# Patient Record
Sex: Female | Born: 1970 | Race: Black or African American | Hispanic: No | Marital: Married | Smoking: Never smoker
Health system: Southern US, Community
[De-identification: ages and names within clinical notes are randomized; demographics above are authoritative.]

## PROBLEM LIST (undated history)

## (undated) DIAGNOSIS — J45909 Unspecified asthma, uncomplicated: Secondary | ICD-10-CM

## (undated) DIAGNOSIS — D649 Anemia, unspecified: Secondary | ICD-10-CM

## (undated) DIAGNOSIS — T7840XA Allergy, unspecified, initial encounter: Secondary | ICD-10-CM

## (undated) DIAGNOSIS — K219 Gastro-esophageal reflux disease without esophagitis: Secondary | ICD-10-CM

## (undated) DIAGNOSIS — R519 Headache, unspecified: Secondary | ICD-10-CM

## (undated) DIAGNOSIS — R51 Headache: Secondary | ICD-10-CM

## (undated) HISTORY — PX: ABDOMINAL HYSTERECTOMY: SHX81

## (undated) HISTORY — PX: AXILLARY LYMPH NODE BIOPSY: SHX5737

## (undated) HISTORY — PX: CARPAL TUNNEL RELEASE: SHX101

## (undated) HISTORY — DX: Allergy, unspecified, initial encounter: T78.40XA

## (undated) HISTORY — PX: APPENDECTOMY: SHX54

---

## 2007-01-07 ENCOUNTER — Ambulatory Visit: Payer: Self-pay | Admitting: Obstetrics and Gynecology

## 2007-01-11 ENCOUNTER — Inpatient Hospital Stay: Payer: Self-pay | Admitting: Obstetrics and Gynecology

## 2008-03-11 ENCOUNTER — Ambulatory Visit: Payer: Self-pay | Admitting: Orthopedic Surgery

## 2008-03-18 ENCOUNTER — Ambulatory Visit: Payer: Self-pay | Admitting: Orthopedic Surgery

## 2009-02-14 ENCOUNTER — Ambulatory Visit: Payer: Self-pay | Admitting: Internal Medicine

## 2009-07-16 ENCOUNTER — Ambulatory Visit: Payer: Self-pay | Admitting: Internal Medicine

## 2013-03-03 ENCOUNTER — Encounter: Payer: Self-pay | Admitting: Podiatry

## 2013-03-03 ENCOUNTER — Ambulatory Visit (INDEPENDENT_AMBULATORY_CARE_PROVIDER_SITE_OTHER): Payer: BC Managed Care – PPO | Admitting: Podiatry

## 2013-03-03 VITALS — BP 128/78 | HR 89 | Resp 16 | Ht 62.0 in | Wt 170.0 lb

## 2013-03-03 DIAGNOSIS — M778 Other enthesopathies, not elsewhere classified: Secondary | ICD-10-CM

## 2013-03-03 DIAGNOSIS — M779 Enthesopathy, unspecified: Secondary | ICD-10-CM

## 2013-03-03 DIAGNOSIS — G5791 Unspecified mononeuropathy of right lower limb: Secondary | ICD-10-CM

## 2013-03-03 DIAGNOSIS — G5790 Unspecified mononeuropathy of unspecified lower limb: Secondary | ICD-10-CM

## 2013-03-03 DIAGNOSIS — M7750 Other enthesopathy of unspecified foot: Secondary | ICD-10-CM

## 2013-03-03 NOTE — Progress Notes (Signed)
The right foot in this joint still hurting.  Objective: Vital signs are stable she is alert and oriented x3. She has pain on palpation and in range of motion of the first metatarsophalangeal joint of the right foot. She has pain on palpation of the medial dorsal cutaneous nerve as it courses over the first metatarsophalangeal joint right.  Assessment: Capsulitis the neuritis of the first metatarsophalangeal joint of the right foot.  Plan: Discussed appropriate shoe gear stretching exercises and ice therapy. Injected dexamethasone to the point of maximal tenderness today and I will followup with her as needed.

## 2013-11-03 ENCOUNTER — Ambulatory Visit: Payer: BC Managed Care – PPO | Admitting: Podiatry

## 2013-11-19 ENCOUNTER — Ambulatory Visit (INDEPENDENT_AMBULATORY_CARE_PROVIDER_SITE_OTHER): Payer: BC Managed Care – PPO | Admitting: Podiatry

## 2013-11-19 VITALS — BP 128/75 | HR 73 | Resp 16

## 2013-11-19 DIAGNOSIS — M778 Other enthesopathies, not elsewhere classified: Secondary | ICD-10-CM

## 2013-11-19 DIAGNOSIS — G5791 Unspecified mononeuropathy of right lower limb: Secondary | ICD-10-CM

## 2013-11-19 DIAGNOSIS — M779 Enthesopathy, unspecified: Secondary | ICD-10-CM

## 2013-11-19 DIAGNOSIS — M7751 Other enthesopathy of right foot: Secondary | ICD-10-CM

## 2013-11-19 MED ORDER — MELOXICAM 15 MG PO TABS: 15.0000 mg | ORAL_TABLET | Freq: Every day | ORAL | Status: DC

## 2013-11-19 MED ORDER — METHYLPREDNISOLONE (PAK) 4 MG PO TABS: ORAL_TABLET | ORAL | Status: DC

## 2013-11-19 NOTE — Progress Notes (Signed)
She presents today chief complaint of pain to the first metatarsophalangeal joint of the right foot. She states this has been going on for the past couple of months. The last time she was in the office was February 2015.  Objective: Vital signs are stable she is alert and oriented x3. She has pain on palpation and in range of motion of the first metatarsophalangeal joint of the right foot. There is no erythema edema cellulitis drainage or odor. Pulses are strongly palpable right foot.  Assessment: Capsulitis first metatarsophalangeal joint right foot. Paragraph plan: Discussed etiology pathology conservative versus surgical therapies..  Plan: Discussed etiology pathology conservative versus surgical therapies. At this point we injected 2 mg of dexamethasone into the joint itself after sterile Betadine skin prep I will followup with her in one month. We started her on Medrol Dosepak to be followed by meloxicam.

## 2014-04-24 ENCOUNTER — Ambulatory Visit: Payer: Self-pay | Admitting: Family Medicine

## 2014-07-15 ENCOUNTER — Other Ambulatory Visit: Payer: Self-pay | Admitting: Neurology

## 2014-07-15 DIAGNOSIS — G4452 New daily persistent headache (NDPH): Secondary | ICD-10-CM

## 2014-07-23 ENCOUNTER — Ambulatory Visit: Payer: Self-pay

## 2015-02-10 ENCOUNTER — Ambulatory Visit
Admission: EM | Admit: 2015-02-10 | Discharge: 2015-02-10 | Disposition: A | Discharge status: Home / Self Care | Payer: BLUE CROSS/BLUE SHIELD | Attending: Family Medicine | Admitting: Family Medicine

## 2015-02-10 ENCOUNTER — Encounter: Payer: Self-pay | Admitting: Emergency Medicine

## 2015-02-10 DIAGNOSIS — R112 Nausea with vomiting, unspecified: Secondary | ICD-10-CM | POA: Diagnosis not present

## 2015-02-10 DIAGNOSIS — N644 Mastodynia: Secondary | ICD-10-CM

## 2015-02-10 LAB — HCG, QUANTITATIVE, PREGNANCY: hCG, Beta Chain, Quant, S: <1 m[IU]/mL (ref <5)

## 2015-02-10 LAB — CBC WITH DIFFERENTIAL/PLATELET
Basophils Absolute: 0 10*3/uL (ref 0–0.1)
Basophils Relative: 0 %
EOS ABS: 0.2 10*3/uL (ref 0–0.7)
Eosinophils Relative: 2 %
HCT: 38.3 % (ref 35.0–47.0)
HEMOGLOBIN: 12.8 g/dL (ref 12.0–16.0)
LYMPHS ABS: 0.4 10*3/uL — AB (ref 1.0–3.6)
Lymphocytes Relative: 4 %
MCH: 28.9 pg (ref 26.0–34.0)
MCHC: 33.5 g/dL (ref 32.0–36.0)
MCV: 86.3 fL (ref 80.0–100.0)
MONOS PCT: 6 %
Monocytes Absolute: 0.6 10*3/uL (ref 0.2–0.9)
Neutro Abs: 8.8 10*3/uL — ABNORMAL HIGH (ref 1.4–6.5)
Neutrophils Relative %: 88 %
Platelets: 277 10*3/uL (ref 150–440)
RBC: 4.44 MIL/uL (ref 3.80–5.20)
RDW: 14.1 % (ref 11.5–14.5)
WBC: 10 10*3/uL (ref 3.6–11.0)

## 2015-02-10 LAB — COMPREHENSIVE METABOLIC PANEL
ALK PHOS: 45 U/L (ref 38–126)
ALT: 13 U/L — ABNORMAL LOW (ref 14–54)
AST: 20 U/L (ref 15–41)
Albumin: 4.2 g/dL (ref 3.5–5.0)
Anion gap: 9 (ref 5–15)
BILIRUBIN TOTAL: 0.8 mg/dL (ref 0.3–1.2)
BUN: 14 mg/dL (ref 6–20)
CALCIUM: 8.7 mg/dL — AB (ref 8.9–10.3)
CO2: 27 mmol/L (ref 22–32)
CREATININE: 0.56 mg/dL (ref 0.44–1.00)
Chloride: 102 mmol/L (ref 101–111)
GFR calc non Af Amer: >60 mL/min (ref >60)
Glucose, Bld: 137 mg/dL — ABNORMAL HIGH (ref 65–99)
Potassium: 4.2 mmol/L (ref 3.5–5.1)
Sodium: 138 mmol/L (ref 135–145)
TOTAL PROTEIN: 7.6 g/dL (ref 6.5–8.1)

## 2015-02-10 MED ORDER — ONDANSETRON 8 MG PO TBDP: 8.0000 mg | ORAL_TABLET | Freq: Two times a day (BID) | ORAL | Status: DC

## 2015-02-10 MED ORDER — ONDANSETRON 4 MG PO TBDP
4.0000 mg | ORAL_TABLET | Freq: Once | ORAL | Status: AC
  Administered 2015-02-10: 4 mg via ORAL

## 2015-02-10 NOTE — ED Notes (Signed)
Patient c/o breast enlargement and tenderness for 2 weeks.  Patient also reports that she started vomiting today.  Patient states that she has had a hysterectomy.  Patient denies fevers.

## 2015-02-10 NOTE — ED Provider Notes (Signed)
CSN: [id number]     Arrival date & time 02/10/15  1543 History   First MD Initiated Contact with Patient 02/10/15 1642     Chief Complaint  Patient presents with  . Emesis  . Breast Problem   (Consider location/radiation/quality/duration/timing/severity/associated sxs/prior Treatment) HPI Comments: 45 yo female with a 2 weeks h/o bilateral breast tenderness and swelling/enlargement. Denies any breast redness, injury, drainage, rash, nipple discharge, injuries. States both breasts feel the same, equally tender and enlarged. Also complains of bilateral lower pelvic pains, like premenstrual. Patient had a hysterectomy years ago, but states still has her ovaries. Today patient had sudden onset of nausea and vomiting. No diarrhea; states she's been constipated lately.   Patient is a 45 y.o. female presenting with vomiting.  Emesis   Past Medical History  Diagnosis Date  . Allergy    Past Surgical History  Procedure Laterality Date  . Abdominal hysterectomy    . Appendectomy    . Cesarean section     History reviewed. No pertinent family history. Social History  Substance Use Topics  . Smoking status: Never Smoker   . Smokeless tobacco: Never Used  . Alcohol Use: No   OB History    No data available     Review of Systems  Gastrointestinal: Positive for vomiting.    Allergies  Review of patient's allergies indicates no known allergies.  Home Medications   Prior to Admission medications   Medication Sig Start Date End Date Taking? Authorizing Provider  esomeprazole (NEXIUM) 20 MG capsule  09/15/13   Historical Provider, MD  fexofenadine-pseudoephedrine (ALLEGRA-D 24) 180-240 MG per 24 hr tablet Take 1 tablet by mouth daily.    Historical Provider, MD  fluticasone Aleda Grana(FLONASE) 50 MCG/ACT nasal spray  02/21/13   Historical Provider, MD  meloxicam (MOBIC) 15 MG tablet  10/22/13   Historical Provider, MD  meloxicam (MOBIC) 15 MG tablet Take 1 tablet (15 mg total) by mouth daily.  11/19/13   Max T Hyatt, DPM  methylPREDNIsolone (MEDROL DOSPACK) 4 MG tablet follow package directions 11/19/13   Max T Hyatt, DPM  metroNIDAZOLE (FLAGYL) 500 MG tablet  11/17/13   Historical Provider, MD  montelukast (SINGULAIR) 10 MG tablet  02/20/13   Historical Provider, MD  ondansetron (ZOFRAN ODT) 8 MG disintegrating tablet Take 1 tablet (8 mg total) by mouth 2 (two) times daily. 02/10/15   Payton Mccallumrlando Kahlyn Shippey, MD  PATADAY 0.2 % SOLN  02/21/13   Historical Provider, MD  PNEUMOVAX 23 25 MCG/0.5ML injection  09/15/13   Historical Provider, MD  traZODone (DESYREL) 50 MG tablet  10/12/13   Historical Provider, MD   Meds Ordered and Administered this Visit   Medications  ondansetron (ZOFRAN-ODT) disintegrating tablet 4 mg (4 mg Oral Given 02/10/15 1602)    BP 139/79 mmHg  Pulse 104  Temp(Src) 98.7 F (37.1 C) (Tympanic)  Resp 16  Ht 5\' 4"  (1.626 m)  Wt 170 lb (77.111 kg)  BMI 29.17 kg/m2  SpO2 100% No data found.   Physical Exam  Constitutional: She appears well-developed and well-nourished. No distress.  HENT:  Head: Normocephalic and atraumatic.  Right Ear: External ear normal.  Left Ear: External ear normal.  Nose: No nose lacerations, sinus tenderness, nasal deformity, septal deviation or nasal septal hematoma. No epistaxis.  No foreign bodies.  Mouth/Throat: Uvula is midline, oropharynx is clear and moist and mucous membranes are normal. No oropharyngeal exudate.  Eyes: Conjunctivae and EOM are normal. Pupils are equal, round, and reactive to light.  Right eye exhibits no discharge. Left eye exhibits no discharge. No scleral icterus.  Neck: Normal range of motion. Neck supple. Thyroid mass (approximately 1cm mildly tender soft tissue  subcutaneous mass  just left of midline on anterior neck area) present. No thyromegaly present.    Cardiovascular: Normal rate, regular rhythm and normal heart sounds.   Pulmonary/Chest: Effort normal and breath sounds normal. No respiratory distress.  She has no wheezes. She has no rales.  Abdominal: Soft. Bowel sounds are normal. She exhibits no distension and no mass. There is tenderness (mild epigastric ). There is no rebound and no guarding.  Genitourinary: There is breast swelling and tenderness. No breast discharge or bleeding.  Lymphadenopathy:    She has no cervical adenopathy.  Neurological: She is alert.  Skin: No rash noted. She is not diaphoretic.  Nursing note and vitals reviewed.   ED Course  Procedures (including critical care time)  Labs Review Labs Reviewed  CBC WITH DIFFERENTIAL/PLATELET - Abnormal; Notable for the following:    Neutro Abs 8.8 (*)    Lymphs Abs 0.4 (*)    All other components within normal limits  COMPREHENSIVE METABOLIC PANEL - Abnormal; Notable for the following:    Glucose, Bld 137 (*)    Calcium 8.7 (*)    ALT 13 (*)    All other components within normal limits  TSH  PROLACTIN  PARATHYROID HORMONE, INTACT (NO CA)  HCG, QUANTITATIVE, PREGNANCY    Imaging Review No results found.   Visual Acuity Review  Right Eye Distance:   Left Eye Distance:   Bilateral Distance:    Right Eye Near:   Left Eye Near:    Bilateral Near:         MDM   1. Breast tenderness   2. Nausea and vomiting, vomiting of unspecified type    Discharge Medication List as of 02/10/2015  6:13 PM    START taking these medications   Details  ondansetron (ZOFRAN ODT) 8 MG disintegrating tablet Take 1 tablet (8 mg total) by mouth 2 (two) times daily., Starting 02/10/2015, Until Discontinued, Normal       1. Lab results and possible etiologies reviewed with patient 2. rx as per orders above; reviewed possible side effects, interactions, risks and benefits  3. Recommend supportive treatment with clear liquids, then advance slowly as tolerated 4. Patient given zofran 8mg  ODT x 1 in clinic with improvement of symptoms and patient was able to tolerate po fluids 5. TSH, prolactin and PTH blood tests  pending 6. Follow-up tomorrow with PCP as scheduled; discussed with patient, needs follow up with PCP as she may need further testing (labs and imaging?)   Payton Mccallum, MD 02/10/15 1828

## 2015-02-11 LAB — PROLACTIN: Prolactin: 35.9 ng/mL — ABNORMAL HIGH (ref 4.8–23.3)

## 2015-02-12 LAB — PARATHYROID HORMONE, INTACT (NO CA): PTH: 72 pg/mL — ABNORMAL HIGH (ref 15–65)

## 2015-02-12 LAB — TSH: TSH: 0.586 u[IU]/mL (ref 0.350–4.500)

## 2015-02-13 ENCOUNTER — Telehealth: Payer: Self-pay

## 2015-02-13 NOTE — ED Notes (Signed)
Prolactin and Parathyroid elevated. Dr. Judd Gaudieronty informed. Patient contacted and stated has seen PCP and further tests and studies have [facility]

## 2015-02-13 NOTE — ED Notes (Signed)
Elevated Prolactin level and  Elevated Parathyroid level. Have not contacted patient.

## 2015-02-13 NOTE — ED Notes (Signed)
Elevated.

## 2015-04-14 ENCOUNTER — Ambulatory Visit (INDEPENDENT_AMBULATORY_CARE_PROVIDER_SITE_OTHER): Payer: BLUE CROSS/BLUE SHIELD | Admitting: Podiatry

## 2015-04-14 ENCOUNTER — Encounter: Payer: Self-pay | Admitting: Podiatry

## 2015-04-14 VITALS — BP 126/62 | HR 91 | Resp 16

## 2015-04-14 DIAGNOSIS — M7751 Other enthesopathy of right foot: Secondary | ICD-10-CM

## 2015-04-14 DIAGNOSIS — M779 Enthesopathy, unspecified: Principal | ICD-10-CM

## 2015-04-14 DIAGNOSIS — M778 Other enthesopathies, not elsewhere classified: Secondary | ICD-10-CM

## 2015-04-14 MED ORDER — MELOXICAM 15 MG PO TABS: 15.0000 mg | ORAL_TABLET | Freq: Every day | ORAL | Status: AC

## 2015-04-14 MED ORDER — METHYLPREDNISOLONE 4 MG PO TBPK: ORAL_TABLET | ORAL | Status: DC

## 2015-04-14 NOTE — Progress Notes (Signed)
She presents today for follow-up of her first metatarsophalangeal joint right foot. She states that after the last injection it quieted down quite nicely but it really never got better. She states that she would like to have another injection. We have not seen her since 2015. She denies any changes in her past medical history medications allergies surgeries or social history.  Objective: Vital signs are stable alert and oriented 3. Pulses are strongly palpable. Neurologic sensorium is intact per Semmes-Weinstein monofilament. No open lesions or wounds. Mild erythema and edema surrounding the first metatarsophalangeal joint of the right foot. It is tender on range of motion.  Assessment: Capsulitis first metatarsophalangeal joint right foot.  Plan: I injected today with dexamethasone and local anesthetic after sterile Betadine skin prep. I will follow-up with her on an as-needed basis.

## 2015-05-11 ENCOUNTER — Telehealth: Payer: Self-pay | Admitting: *Deleted

## 2015-05-11 NOTE — Telephone Encounter (Signed)
Diclofenac 75 mg bid

## 2015-05-11 NOTE — Telephone Encounter (Signed)
Patient requesting a different medication. States that the meloxicam is not helping. Please advise.

## 2015-05-11 NOTE — Telephone Encounter (Signed)
-----   Message from Lanney GinsLisa R Cox, [facility] sent at 05/11/2015  2:03 PM EDT ----- Elvina SidleHey, patient called and stated she was in the office about two weeks ago and got a shot and the medicine that Dr Al CorpusHyatt prescribed is not helping and would like something else and her foot is killing her and phone number is [phone]. Thanks Misty StanleyLisa

## 2015-05-12 MED ORDER — DICLOFENAC SODIUM 75 MG PO TBEC: 75.0000 mg | DELAYED_RELEASE_TABLET | Freq: Two times a day (BID) | ORAL | Status: DC

## 2015-05-12 NOTE — Telephone Encounter (Signed)
Left message informing pt the new rx had [facility] [place] [address]

## 2015-05-12 NOTE — Addendum Note (Signed)
Addended by: Alphia Kava'CONNELL, VALERY D on: 05/12/2015 03:11 PM   Modules accepted: Orders

## 2016-05-25 ENCOUNTER — Ambulatory Visit
Admission: RE | Admit: 2016-05-25 | Discharge: 2016-05-25 | Disposition: A | Discharge status: Home / Self Care | Payer: Worker's Compensation | Source: Ambulatory Visit | Attending: Family | Admitting: Family

## 2016-05-25 ENCOUNTER — Other Ambulatory Visit: Payer: Self-pay | Admitting: Family

## 2016-05-25 DIAGNOSIS — M25562 Pain in left knee: Secondary | ICD-10-CM | POA: Diagnosis not present

## 2016-05-25 DIAGNOSIS — M25662 Stiffness of left knee, not elsewhere classified: Secondary | ICD-10-CM | POA: Diagnosis not present

## 2016-05-25 DIAGNOSIS — R52 Pain, unspecified: Secondary | ICD-10-CM

## 2016-05-25 DIAGNOSIS — W19XXXA Unspecified fall, initial encounter: Secondary | ICD-10-CM | POA: Insufficient documentation

## 2016-08-07 ENCOUNTER — Ambulatory Visit
Admission: EM | Admit: 2016-08-07 | Discharge: 2016-08-07 | Discharge status: Absent without leave | Payer: BLUE CROSS/BLUE SHIELD

## 2016-09-17 ENCOUNTER — Other Ambulatory Visit: Payer: Self-pay | Admitting: Emergency Medicine

## 2016-11-03 ENCOUNTER — Other Ambulatory Visit: Payer: Self-pay | Admitting: Orthopedic Surgery

## 2016-11-16 ENCOUNTER — Inpatient Hospital Stay: Admission: RE | Admit: 2016-11-16 | Payer: Self-pay | Source: Ambulatory Visit

## 2016-11-17 ENCOUNTER — Encounter: Admission: RE | Admit: 2016-11-17 | Payer: BLUE CROSS/BLUE SHIELD | Source: Ambulatory Visit

## 2016-11-20 ENCOUNTER — Inpatient Hospital Stay: Admission: RE | Admit: 2016-11-20 | Payer: BLUE CROSS/BLUE SHIELD | Source: Ambulatory Visit

## 2016-11-21 ENCOUNTER — Encounter
Admission: RE | Admit: 2016-11-21 | Discharge: 2016-11-21 | Disposition: A | Discharge status: Home / Self Care | Payer: BLUE CROSS/BLUE SHIELD | Source: Ambulatory Visit | Attending: Orthopedic Surgery | Admitting: Orthopedic Surgery

## 2016-11-21 HISTORY — DX: Anemia, unspecified: D64.9

## 2016-11-21 HISTORY — DX: Unspecified asthma, uncomplicated: J45.909

## 2016-11-21 HISTORY — DX: Headache, unspecified: R51.9

## 2016-11-21 HISTORY — DX: Gastro-esophageal reflux disease without esophagitis: K21.9

## 2016-11-21 HISTORY — DX: Headache: R51

## 2016-11-21 NOTE — Patient Instructions (Signed)
Your procedure is scheduled on: 11-23-16 THURSDAY Report to Same Day Surgery 2nd floor medical mall [facility](Medical Mall Entrance-take elevator on left to 2nd floor.  Check in with surgery information desk.) To find out your arrival time please call [phone] between 1PM - 3PM [facility]  Remember: Instructions that are not followed completely may result in serious medical risk, up to and including death, or upon the discretion of your surgeon and anesthesiologist your surgery may need to be rescheduled.    _x___ 1. Do not eat food after midnight the night before your procedure. NO GUM CHEWING OR HARD CANDIES.  You may drink clear liquids up to 2 hours before you are scheduled to arrive at the hospital for your procedure.  Do not drink clear liquids within 2 hours of your scheduled arrival to the hospital.  Clear liquids include  --Water or Apple juice without pulp  --Clear carbohydrate beverage such as ClearFast or Gatorade  --Black Coffee or Clear Tea (No milk, no creamers, do not add anything to the coffee or Tea)  Type 1 and type 2 diabetics should only drink water.     __x__ 2. No Alcohol for 24 hours before or after surgery.   __x__3. No Smoking for 24 prior to surgery.   ____  4. Bring all medications with you on the day of surgery if instructed.    __x__ 5. Notify your doctor if there is any change in your medical condition     (cold, fever, infections).     Do not wear jewelry, make-up, hairpins, clips or nail polish.  Do not wear lotions, powders, or perfumes. You may wear deodorant.  Do not shave 48 hours prior to surgery. Men may shave face and neck.  Do not bring valuables to the hospital.    [facility] is not responsible for any belongings or valuables.               Contacts, dentures or bridgework may not be worn into surgery.  Leave your suitcase in the car. After surgery it may be brought to your room.  For patients admitted to the hospital, discharge  time is determined by your treatment team.   Patients discharged the day of surgery will not be allowed to drive home.  You will need someone to drive you home and stay with you the night of your procedure.    Please read over the following fact sheets that you were given:   [facility] Preparing for Surgery and or MRSA Information   _x___ TAKE THE FOLLOWING MEDICATIONS THE MORNING OF SURGERY WITH A SMALL SIP OF WATER. These include:  1. NEXIUM  2. TAKE A NEXIUM ON Wednesday NIGHT BEFORE BED  3.  4.  5.  6.  ____Fleets enema or Magnesium Citrate as directed.   _x___ Use CHG Soap or sage wipes as directed on instruction sheet   _X___ Use inhalers on the day of surgery and bring to hospital day of surgery-USE ALBUTEROL INHALER AT HOME AND BRING INHALER TO HOSPITAL  ____ Stop Metformin and Janumet 2 days prior to surgery.    ____ Take 1/2 of usual insulin dose the night before surgery and none on the morning surgery.   ____ Follow recommendations from Cardiologist, Pulmonologist or PCP regarding stopping Aspirin, Coumadin, Plavix ,Eliquis, Effient, or Pradaxa, and Pletal.  X____Stop Anti-inflammatories such as Advil, Aleve, Ibuprofen, Motrin, Naproxen,MELOXICAM, Naprosyn, BC POWDERS or aspirin products NOW-OK to take Tylenol    ____  Stop supplements until after surgery.     ____ Bring C-Pap to the hospital.

## 2016-11-22 ENCOUNTER — Ambulatory Visit
Admission: RE | Admit: 2016-11-22 | Discharge: 2016-11-22 | Disposition: A | Discharge status: Home / Self Care | Payer: Worker's Compensation | Source: Ambulatory Visit | Attending: Orthopedic Surgery | Admitting: Orthopedic Surgery

## 2016-11-22 DIAGNOSIS — Z01818 Encounter for other preprocedural examination: Secondary | ICD-10-CM | POA: Insufficient documentation

## 2016-11-22 LAB — APTT: APTT: 28 s (ref 24–36)

## 2016-11-22 LAB — CBC WITH DIFFERENTIAL/PLATELET
Basophils Absolute: 0 10*3/uL (ref 0–0.1)
Basophils Relative: 1 %
EOS ABS: 0.4 10*3/uL (ref 0–0.7)
EOS PCT: 8 %
HCT: 37.8 % (ref 35.0–47.0)
HEMOGLOBIN: 12.7 g/dL (ref 12.0–16.0)
LYMPHS ABS: 2 10*3/uL (ref 1.0–3.6)
LYMPHS PCT: 34 %
MCH: 28.7 pg (ref 26.0–34.0)
MCHC: 33.6 g/dL (ref 32.0–36.0)
MCV: 85.6 fL (ref 80.0–100.0)
Monocytes Absolute: 0.5 10*3/uL (ref 0.2–0.9)
Monocytes Relative: 9 %
Neutro Abs: 2.8 10*3/uL (ref 1.4–6.5)
Neutrophils Relative %: 48 %
PLATELETS: 287 10*3/uL (ref 150–440)
RBC: 4.42 MIL/uL (ref 3.80–5.20)
RDW: 14 % (ref 11.5–14.5)
WBC: 5.7 10*3/uL (ref 3.6–11.0)

## 2016-11-22 LAB — BASIC METABOLIC PANEL
Anion gap: 6 (ref 5–15)
BUN: 11 mg/dL (ref 6–20)
CO2: 25 mmol/L (ref 22–32)
Calcium: 8.8 mg/dL — ABNORMAL LOW (ref 8.9–10.3)
Chloride: 106 mmol/L (ref 101–111)
Creatinine, Ser: 0.56 mg/dL (ref 0.44–1.00)
GFR calc Af Amer: >60 mL/min (ref >60)
GLUCOSE: 100 mg/dL — AB (ref 65–99)
POTASSIUM: 3.9 mmol/L (ref 3.5–5.1)
Sodium: 137 mmol/L (ref 135–145)

## 2016-11-22 LAB — PROTIME-INR
INR: 1.03
Prothrombin Time: 13.4 seconds (ref 11.4–15.2)

## 2016-11-23 ENCOUNTER — Ambulatory Visit: Payer: Worker's Compensation | Admitting: Certified Registered Nurse Anesthetist

## 2016-11-23 ENCOUNTER — Encounter: Payer: Self-pay | Admitting: *Deleted

## 2016-11-23 ENCOUNTER — Encounter
Admission: RE | Disposition: A | Discharge status: Home / Self Care | Payer: Self-pay | Source: Ambulatory Visit | Attending: Orthopedic Surgery

## 2016-11-23 ENCOUNTER — Ambulatory Visit
Admission: RE | Admit: 2016-11-23 | Discharge: 2016-11-23 | Disposition: A | Discharge status: Home / Self Care | Payer: Worker's Compensation | Source: Ambulatory Visit | Attending: Orthopedic Surgery | Admitting: Orthopedic Surgery

## 2016-11-23 DIAGNOSIS — M65862 Other synovitis and tenosynovitis, left lower leg: Secondary | ICD-10-CM | POA: Diagnosis not present

## 2016-11-23 DIAGNOSIS — X58XXXA Exposure to other specified factors, initial encounter: Secondary | ICD-10-CM | POA: Diagnosis not present

## 2016-11-23 DIAGNOSIS — K219 Gastro-esophageal reflux disease without esophagitis: Secondary | ICD-10-CM | POA: Insufficient documentation

## 2016-11-23 DIAGNOSIS — Z791 Long term (current) use of non-steroidal anti-inflammatories (NSAID): Secondary | ICD-10-CM | POA: Diagnosis not present

## 2016-11-23 DIAGNOSIS — Z7982 Long term (current) use of aspirin: Secondary | ICD-10-CM | POA: Insufficient documentation

## 2016-11-23 DIAGNOSIS — D649 Anemia, unspecified: Secondary | ICD-10-CM | POA: Insufficient documentation

## 2016-11-23 DIAGNOSIS — Z79899 Other long term (current) drug therapy: Secondary | ICD-10-CM | POA: Diagnosis not present

## 2016-11-23 DIAGNOSIS — S83232A Complex tear of medial meniscus, current injury, left knee, initial encounter: Secondary | ICD-10-CM | POA: Diagnosis not present

## 2016-11-23 DIAGNOSIS — J45909 Unspecified asthma, uncomplicated: Secondary | ICD-10-CM | POA: Insufficient documentation

## 2016-11-23 HISTORY — PX: KNEE ARTHROSCOPY WITH MEDIAL MENISECTOMY: SHX5651

## 2016-11-23 SURGERY — ARTHROSCOPY, KNEE, WITH MEDIAL MENISCECTOMY
Anesthesia: General | Laterality: Left

## 2016-11-23 MED ORDER — PHENYLEPHRINE HCL 10 MG/ML IJ SOLN
INTRAMUSCULAR | Status: DC | PRN
  Administered 2016-11-23 (×4): 100 ug via INTRAVENOUS

## 2016-11-23 MED ORDER — ASPIRIN EC 325 MG PO TBEC: 325.0000 mg | DELAYED_RELEASE_TABLET | Freq: Every day | ORAL | 0 refills | Status: AC

## 2016-11-23 MED ORDER — MIDAZOLAM HCL 2 MG/2ML IJ SOLN
INTRAMUSCULAR | Status: AC
  Filled 2016-11-23: qty 2

## 2016-11-23 MED ORDER — LACTATED RINGERS IV SOLN
INTRAVENOUS | Status: DC
  Administered 2016-11-23: 09:00:00 via INTRAVENOUS

## 2016-11-23 MED ORDER — GLYCOPYRROLATE 0.2 MG/ML IJ SOLN
INTRAMUSCULAR | Status: AC
  Filled 2016-11-23: qty 1

## 2016-11-23 MED ORDER — DEXAMETHASONE SODIUM PHOSPHATE 10 MG/ML IJ SOLN
INTRAMUSCULAR | Status: AC
  Filled 2016-11-23: qty 1

## 2016-11-23 MED ORDER — LIDOCAINE HCL (PF) 1 % IJ SOLN
INTRAMUSCULAR | Status: DC | PRN
  Administered 2016-11-23: 9 mL

## 2016-11-23 MED ORDER — FENTANYL CITRATE (PF) 100 MCG/2ML IJ SOLN
25.0000 ug | INTRAMUSCULAR | Status: DC | PRN
  Administered 2016-11-23 (×3): 25 ug via INTRAVENOUS

## 2016-11-23 MED ORDER — ONDANSETRON HCL 4 MG/2ML IJ SOLN
INTRAMUSCULAR | Status: AC
  Filled 2016-11-23: qty 2

## 2016-11-23 MED ORDER — LIDOCAINE HCL (PF) 1 % IJ SOLN
INTRAMUSCULAR | Status: AC
  Filled 2016-11-23: qty 30

## 2016-11-23 MED ORDER — ALBUTEROL SULFATE HFA 108 (90 BASE) MCG/ACT IN AERS
INHALATION_SPRAY | RESPIRATORY_TRACT | Status: AC
  Filled 2016-11-23: qty 6.7

## 2016-11-23 MED ORDER — FENTANYL CITRATE (PF) 100 MCG/2ML IJ SOLN
INTRAMUSCULAR | Status: AC
  Filled 2016-11-23: qty 2

## 2016-11-23 MED ORDER — ACETAMINOPHEN 10 MG/ML IV SOLN
INTRAVENOUS | Status: DC | PRN
  Administered 2016-11-23: 1000 mg via INTRAVENOUS

## 2016-11-23 MED ORDER — GLYCOPYRROLATE 0.2 MG/ML IJ SOLN
INTRAMUSCULAR | Status: DC | PRN
  Administered 2016-11-23: 0.2 mg via INTRAVENOUS

## 2016-11-23 MED ORDER — PROPOFOL 10 MG/ML IV BOLUS
INTRAVENOUS | Status: DC | PRN
  Administered 2016-11-23: 200 mg via INTRAVENOUS

## 2016-11-23 MED ORDER — CEFAZOLIN SODIUM-DEXTROSE 2-4 GM/100ML-% IV SOLN
2.0000 g | INTRAVENOUS | Status: AC
  Administered 2016-11-23: 2 g via INTRAVENOUS

## 2016-11-23 MED ORDER — BUPIVACAINE-EPINEPHRINE (PF) 0.25% -1:200000 IJ SOLN
INTRAMUSCULAR | Status: DC | PRN
  Administered 2016-11-23: 20 mL

## 2016-11-23 MED ORDER — LIDOCAINE HCL (CARDIAC) 20 MG/ML IV SOLN
INTRAVENOUS | Status: DC | PRN
  Administered 2016-11-23: 100 mg via INTRAVENOUS

## 2016-11-23 MED ORDER — ONDANSETRON HCL 4 MG/2ML IJ SOLN
4.0000 mg | Freq: Once | INTRAMUSCULAR | Status: DC | PRN
Start: 2016-11-23 — End: 2016-11-23

## 2016-11-23 MED ORDER — CHLORHEXIDINE GLUCONATE CLOTH 2 % EX PADS
6.0000 | MEDICATED_PAD | Freq: Once | CUTANEOUS | Status: AC
  Administered 2016-11-23: 6 via TOPICAL

## 2016-11-23 MED ORDER — PROPOFOL 10 MG/ML IV BOLUS
INTRAVENOUS | Status: AC
  Filled 2016-11-23: qty 20

## 2016-11-23 MED ORDER — CEFAZOLIN SODIUM-DEXTROSE 2-4 GM/100ML-% IV SOLN
INTRAVENOUS | Status: AC
  Filled 2016-11-23: qty 100

## 2016-11-23 MED ORDER — HYDROCODONE-ACETAMINOPHEN 5-325 MG PO TABS: 1.0000 | ORAL_TABLET | Freq: Four times a day (QID) | ORAL | 0 refills | Status: DC | PRN

## 2016-11-23 MED ORDER — FENTANYL CITRATE (PF) 100 MCG/2ML IJ SOLN
INTRAMUSCULAR | Status: AC
  Administered 2016-11-23: 25 ug via INTRAVENOUS
  Filled 2016-11-23: qty 2

## 2016-11-23 MED ORDER — ONDANSETRON HCL 4 MG PO TABS: 4.0000 mg | ORAL_TABLET | Freq: Three times a day (TID) | ORAL | 0 refills | Status: AC | PRN

## 2016-11-23 MED ORDER — HYDROCODONE-ACETAMINOPHEN 5-325 MG PO TABS
ORAL_TABLET | ORAL | Status: AC
  Filled 2016-11-23: qty 1

## 2016-11-23 MED ORDER — HYDROCODONE-ACETAMINOPHEN 5-325 MG PO TABS
1.0000 | ORAL_TABLET | Freq: Four times a day (QID) | ORAL | Status: DC | PRN
  Administered 2016-11-23: 1 via ORAL

## 2016-11-23 MED ORDER — FENTANYL CITRATE (PF) 100 MCG/2ML IJ SOLN
INTRAMUSCULAR | Status: DC | PRN
  Administered 2016-11-23 (×4): 50 ug via INTRAVENOUS

## 2016-11-23 MED ORDER — DEXAMETHASONE SODIUM PHOSPHATE 10 MG/ML IJ SOLN
INTRAMUSCULAR | Status: DC | PRN
  Administered 2016-11-23: 10 mg via INTRAVENOUS

## 2016-11-23 MED ORDER — BUPIVACAINE-EPINEPHRINE (PF) 0.25% -1:200000 IJ SOLN
INTRAMUSCULAR | Status: AC
  Filled 2016-11-23: qty 30

## 2016-11-23 MED ORDER — ACETAMINOPHEN 10 MG/ML IV SOLN
INTRAVENOUS | Status: AC
  Filled 2016-11-23: qty 100

## 2016-11-23 MED ORDER — MIDAZOLAM HCL 2 MG/2ML IJ SOLN
INTRAMUSCULAR | Status: DC | PRN
  Administered 2016-11-23: 2 mg via INTRAVENOUS

## 2016-11-23 MED ORDER — LIDOCAINE HCL (PF) 2 % IJ SOLN
INTRAMUSCULAR | Status: AC
  Filled 2016-11-23: qty 10

## 2016-11-23 MED ORDER — ONDANSETRON HCL 4 MG/2ML IJ SOLN
INTRAMUSCULAR | Status: DC | PRN
  Administered 2016-11-23 (×2): 4 mg via INTRAVENOUS

## 2016-11-23 MED ORDER — ALBUTEROL SULFATE HFA 108 (90 BASE) MCG/ACT IN AERS
INHALATION_SPRAY | RESPIRATORY_TRACT | Status: DC | PRN
  Administered 2016-11-23: 2 via RESPIRATORY_TRACT

## 2016-11-23 MED ORDER — CHLORHEXIDINE GLUCONATE CLOTH 2 % EX PADS: 6.0000 | MEDICATED_PAD | Freq: Once | CUTANEOUS | Status: DC

## 2016-11-23 SURGICAL SUPPLY — 44 items
ADAPTER IRRIG TUBE 2 SPIKE SOL (ADAPTER) ×4 IMPLANT
BUR RADIUS 3.5 (BURR) ×2 IMPLANT
BUR RADIUS 4.0X18.5 (BURR) ×2 IMPLANT
CANISTER SUCT LVC 12 LTR MEDI- (MISCELLANEOUS) IMPLANT
COOLER POLAR GLACIER W/PUMP (MISCELLANEOUS) IMPLANT
CUFF TOURN 24 STER (MISCELLANEOUS) ×2 IMPLANT
CUFF TOURN 30 STER DUAL PORT (MISCELLANEOUS) IMPLANT
DEVICE SUCT BLK HOLE OR FLOOR (MISCELLANEOUS) ×2 IMPLANT
DRAPE IMP U-DRAPE 54X76 (DRAPES) ×2 IMPLANT
DRAPE SHEET LG 3/4 BI-LAMINATE (DRAPES) ×2 IMPLANT
DURAPREP 26ML APPLICATOR (WOUND CARE) ×4 IMPLANT
GAUZE PETRO XEROFOAM 1X8 (MISCELLANEOUS) IMPLANT
GAUZE SPONGE 4X4 12PLY STRL (GAUZE/BANDAGES/DRESSINGS) ×2 IMPLANT
GAUZE XEROFORM 4X4 STRL (GAUZE/BANDAGES/DRESSINGS) ×2 IMPLANT
GLOVE BIOGEL PI IND STRL 6.5 (GLOVE) ×6 IMPLANT
GLOVE BIOGEL PI IND STRL 9 (GLOVE) ×1 IMPLANT
GLOVE BIOGEL PI INDICATOR 6.5 (GLOVE) ×6
GLOVE BIOGEL PI INDICATOR 9 (GLOVE) ×1
GLOVE SURG 9.0 ORTHO LTXF (GLOVE) ×4 IMPLANT
GOWN STRL REUS TWL 2XL XL LVL4 (GOWN DISPOSABLE) ×2 IMPLANT
GOWN STRL REUS W/ TWL LRG LVL3 (GOWN DISPOSABLE) ×1 IMPLANT
GOWN STRL REUS W/TWL LRG LVL3 (GOWN DISPOSABLE) ×1
IV LACTATED RINGER IRRG 3000ML (IV SOLUTION) ×6
IV LR IRRIG 3000ML ARTHROMATIC (IV SOLUTION) ×6 IMPLANT
KIT RM TURNOVER STRD PROC AR (KITS) ×2 IMPLANT
MANIFOLD NEPTUNE II (INSTRUMENTS) ×2 IMPLANT
MAT BLUE FLOOR 46X72 FLO (MISCELLANEOUS) ×2 IMPLANT
NEEDLE HYPO 22GX1.5 SAFETY (NEEDLE) ×2 IMPLANT
PACK ARTHROSCOPY KNEE (MISCELLANEOUS) ×2 IMPLANT
PAD ABD DERMACEA PRESS 5X9 (GAUZE/BANDAGES/DRESSINGS) ×4 IMPLANT
PAD WRAPON POLAR KNEE (MISCELLANEOUS) ×1 IMPLANT
SET TUBE SUCT SHAVER OUTFL 24K (TUBING) ×2 IMPLANT
SET TUBE TIP INTRA-ARTICULAR (MISCELLANEOUS) ×2 IMPLANT
SOL PREP PVP 2OZ (MISCELLANEOUS)
SOLUTION PREP PVP 2OZ (MISCELLANEOUS) IMPLANT
STRIP CLOSURE SKIN 1/2X4 (GAUZE/BANDAGES/DRESSINGS) ×2 IMPLANT
SUT ETHILON 4-0 (SUTURE) ×1
SUT ETHILON 4-0 FS2 18XMFL BLK (SUTURE) ×1
SUTURE ETHLN 4-0 FS2 18XMF BLK (SUTURE) ×1 IMPLANT
SYR 20CC LL (SYRINGE) ×2 IMPLANT
TUBING ARTHRO INFLOW-ONLY STRL (TUBING) ×2 IMPLANT
WAND HAND CNTRL MULTIVAC 50 (MISCELLANEOUS) IMPLANT
WAND HAND CNTRL MULTIVAC 90 (MISCELLANEOUS) ×2 IMPLANT
WRAPON POLAR PAD KNEE (MISCELLANEOUS) ×2

## 2016-11-23 NOTE — Discharge Instructions (Signed)

## 2016-11-23 NOTE — Anesthesia Post-op Follow-up Note (Signed)
Anesthesia QCDR form completed.        

## 2016-11-23 NOTE — Op Note (Signed)
  PATIENT:  Jessica Hicks  PRE-OPERATIVE DIAGNOSIS:  TEAR OF MEDIAL MENISCUS, LEFT KNEE  POST-OPERATIVE DIAGNOSIS:  TEAR OF MEDIAL MENISCUS, LEFT KNEE with synovitis, chondrosis of medial femoral condyle.  PROCEDURE:  KNEE ARTHROSCOPY WITH  Partial MEDIAL MENISECTOMY, synovectomy, chondroplasty of the medial femoral condyle  SURGEON:  Thornton Park, MD  ANESTHESIA:   General  PREOPERATIVE INDICATIONS:  Jessica Hicks  46 y.o. female with a diagnosis of TEAR OF MEDIAL MENISCUS, LEFT KNEE who failed conservative management and elected for surgical management.    The risks benefits and alternatives were discussed with the patient preoperatively including the risks of infection, bleeding, nerve injury, knee stiffness, persistent pain, osteoarthritis and the need for further surgery. Medical  risks include DVT and pulmonary embolism, myocardial infarction, stroke, pneumonia, respiratory failure and death. The patient understood these risks and wished to proceed.  OPERATIVE PROCEDURE: Patient was met in the preoperative area. The left operative lower extremity was signed with the word yes and my initials according the hospital's correct site of surgery protocol.  The patient was brought to the operating room where they was placed supine on the operative table. General anesthesia was administered. The patient was prepped and draped in a sterile fashion.  A timeout was performed to verify the patient's name, date of birth, medical record number, correct site of surgery correct procedure to be performed. It was also used to verify the patient received antibiotics that all appropriate instruments, and radiographic studies were available in the room. Once all in attendance were in agreement, the case began.  Proposed arthroscopy incisions were drawn out with a surgical marker. These were pre-injected with 1% lidocaine plain. An 11 blade was used to establish an inferior lateral and  inferomedial portals. The inferomedial portal was created using a 18-gauge spinal needle under direct visualization.  A full diagnostic examination of the knee was performed including the suprapatellar pouch, patellofemoral joint, medial lateral compartments as well as the medial lateral gutters, the intercondylar notch in the posterior knee.  Findings on arthroscopy included diffuse synovitis, chondrosis of the medial femoral condyle, tear involving the posterior horn of the medial meniscus with a radial component extending into the meniscal root. .  Patient had the unstable medial meniscal tear treated with a 4-0 resector shaver blade and straight duckbill basket. The meniscus was debrided until a stable rim was achieved. Probe was used to ensure a stable meniscal rim after meniscectomy was completed.  A chondroplasty of the medial femoral condyle was also performed using a 4-0 resector shaver blade. A partial synovectomy was also performed using a 4-0 resector shaver blade and 90 ArthroCare wand.  The knee was then copiously lavaged. All arthroscopic instruments were removed. The 2 arthroscopy portals were closed with 4-0 nylon. Steri-Strips were applied along with a dry sterile and compressive dressing. The patient was brought to the PACU in stable condition.   I was scrubbed and present for the entire case and all sharp and instrument counts were correct at the conclusion the case. I spoke with the patient's family postoperatively to let them know the case was performed without complication and the patient was stable in the recovery room.    Timoteo Gaul, MD

## 2016-11-23 NOTE — H&P (Signed)
The patient has been re-examined, and the chart reviewed, and there have been no interval changes to the documented history and physical.    The risks, benefits, and alternatives have been discussed at length, and the patient is willing to proceed.   

## 2016-11-23 NOTE — Anesthesia Procedure Notes (Signed)
Procedure Name: LMA Insertion Date/Time: 11/23/2016 10:02 AM Performed by: Marlana SalvageJESSUP, Jessica Gary Pre-anesthesia Checklist: Patient identified, Emergency Drugs available, Suction available, Patient being monitored and Timeout performed Patient Re-evaluated:Patient Re-evaluated prior to induction Oxygen Delivery Method: Circle system utilized Preoxygenation: Pre-oxygenation with 100% oxygen Induction Type: IV induction Ventilation: Mask ventilation without difficulty LMA: LMA inserted LMA Size: 4.0 Number of attempts: 1 Placement Confirmation: positive ETCO2 and breath sounds checked- equal and bilateral Tube secured with: Tape Dental Injury: Teeth and Oropharynx as per pre-operative assessment

## 2016-11-23 NOTE — Anesthesia Preprocedure Evaluation (Signed)
Anesthesia Evaluation  Patient identified by MRN, date of birth, ID band Patient awake    Reviewed: Allergy & Precautions, NPO status , Patient's Chart, lab work & pertinent test results  Airway Mallampati: II  TM Distance: >3 FB     Dental  (+) Teeth Intact   Pulmonary asthma ,    Pulmonary exam normal        Cardiovascular negative cardio ROS Normal cardiovascular exam     Neuro/Psych  Headaches, negative psych ROS   GI/Hepatic GERD  Medicated and Controlled,  Endo/Other    Renal/GU   negative genitourinary   Musculoskeletal negative musculoskeletal ROS (+)   Abdominal Normal abdominal exam  (+)   Peds negative pediatric ROS (+)  Hematology  (+) anemia ,   Anesthesia Other Findings   Reproductive/Obstetrics                             Anesthesia Physical Anesthesia Plan  ASA: II  Anesthesia Plan: General   Post-op Pain Management:    Induction:   PONV Risk Score and Plan:   Airway Management Planned: LMA  Additional Equipment:   Intra-op Plan:   Post-operative Plan: Extubation in OR  Informed Consent: I have reviewed the patients History and Physical, chart, labs and discussed the procedure including the risks, benefits and alternatives for the proposed anesthesia with the patient or authorized representative who has indicated his/her understanding and acceptance.   Dental advisory given  Plan Discussed with: CRNA and Surgeon  Anesthesia Plan Comments:         Anesthesia Quick Evaluation

## 2016-11-23 NOTE — Transfer of Care (Signed)
Immediate Anesthesia Transfer of Care Note  Patient: Jessica MaplesDevesia Dawn Hicks  Procedure(s) Performed: KNEE ARTHROSCOPY WITH MEDIAL MENISECTOMY (Left )  Patient Location: PACU  Anesthesia Type:General  Level of Consciousness: awake, alert , oriented and patient cooperative  Airway & Oxygen Therapy: Patient Spontanous Breathing and Patient connected to nasal cannula oxygen  Post-op Assessment: Report given to RN and Post -op Vital signs reviewed and stable  Post vital signs: Reviewed and stable  Last Vitals:  Vitals:   11/23/16 0850 11/23/16 1139  BP: 119/74 109/64  Pulse: 73 88  Resp: 16 14  Temp: (!) 36.4 C 36.4 C  SpO2: 100% 100%    Last Pain:  Vitals:   11/23/16 1139  TempSrc: Temporal  PainSc: 0-No pain         Complications: No apparent anesthesia complications

## 2016-11-24 NOTE — Anesthesia Postprocedure Evaluation (Signed)
Anesthesia Post Note  Patient: Jessica MaplesDevesia Dawn Hicks  Procedure(s) Performed: KNEE ARTHROSCOPY WITH MEDIAL MENISECTOMY (Left )  Patient location during evaluation: PACU Anesthesia Type: General Level of consciousness: awake and alert and oriented Pain management: pain level controlled Vital Signs Assessment: post-procedure vital signs reviewed and stable Respiratory status: spontaneous breathing Cardiovascular status: blood pressure returned to baseline Anesthetic complications: no     Last Vitals:  Vitals:   11/23/16 1209 11/23/16 1220  BP: 109/61 105/63  Pulse: 75 72  Resp: 14 12  Temp:  36.6 C  SpO2: 98% 95%    Last Pain:  Vitals:   11/23/16 1235  TempSrc:   PainSc: 5                  Amari Burnsworth

## 2017-02-26 ENCOUNTER — Other Ambulatory Visit: Payer: Self-pay | Admitting: Emergency Medicine

## 2018-09-27 ENCOUNTER — Other Ambulatory Visit: Payer: Self-pay

## 2018-09-27 DIAGNOSIS — Z20822 Contact with and (suspected) exposure to covid-19: Secondary | ICD-10-CM

## 2018-09-29 LAB — NOVEL CORONAVIRUS, NAA: SARS-CoV-2, NAA: NOT DETECTED

## 2018-12-31 ENCOUNTER — Other Ambulatory Visit: Payer: Self-pay

## 2018-12-31 DIAGNOSIS — Z20822 Contact with and (suspected) exposure to covid-19: Secondary | ICD-10-CM

## 2019-01-02 LAB — NOVEL CORONAVIRUS, NAA: SARS-CoV-2, NAA: NOT DETECTED

## 2019-02-20 ENCOUNTER — Ambulatory Visit: Payer: BLUE CROSS/BLUE SHIELD | Attending: Internal Medicine

## 2019-02-20 DIAGNOSIS — Z23 Encounter for immunization: Secondary | ICD-10-CM | POA: Diagnosis present

## 2019-02-20 NOTE — Progress Notes (Signed)
   Covid-19 Vaccination Clinic  Name:  Jessica Hicks    MRN: [id number] DOB: Dec 18, 1970  02/20/2019  Jessica Hicks was observed post Covid-19 immunization for 15 minutes without incidence. She was provided with Vaccine Information Sheet and instruction to access the V-Safe system.   Jessica Hicks was instructed to call 911 with any severe reactions post vaccine: Marland Kitchen Difficulty breathing  . Swelling of your face and throat  . A fast heartbeat  . A bad rash all over your body  . Dizziness and weakness    Immunizations Administered    Name Date Dose VIS Date Route   Moderna COVID-19 Vaccine 02/20/2019  6:14 PM 0.5 mL 12/31/2018 Intramuscular   Manufacturer: Moderna   Lot: [id number]   NDC: 98421-031-28

## 2019-03-20 ENCOUNTER — Ambulatory Visit: Payer: Self-pay

## 2019-03-25 ENCOUNTER — Ambulatory Visit: Payer: BC Managed Care – PPO | Attending: Internal Medicine

## 2019-03-25 DIAGNOSIS — Z23 Encounter for immunization: Secondary | ICD-10-CM | POA: Insufficient documentation

## 2019-03-25 NOTE — Progress Notes (Signed)
   Covid-19 Vaccination Clinic  Name:  Reianna Batdorf    MRN: [id number] DOB: [birth year]  03/25/2019  Ms. Sarvis was observed post Covid-19 immunization for 15 minutes without incidence. She was provided with Vaccine Information Sheet and instruction to access the V-Safe system.   Ms. Leano was instructed to call 911 with any severe reactions post vaccine: Marland Kitchen Difficulty breathing  . Swelling of your face and throat  . A fast heartbeat  . A bad rash all over your body  . Dizziness and weakness    Immunizations Administered    Name Date Dose VIS Date Route   Moderna COVID-19 Vaccine 03/25/2019  5:22 PM 0.5 mL 12/31/2018 Intramuscular   Manufacturer: Moderna   Lot: [id number]   NDC: 24932-419-91

## 2019-04-07 ENCOUNTER — Other Ambulatory Visit: Payer: Self-pay | Admitting: Family Medicine

## 2019-04-07 DIAGNOSIS — Z1231 Encounter for screening mammogram for malignant neoplasm of breast: Secondary | ICD-10-CM

## 2019-04-08 ENCOUNTER — Ambulatory Visit
Admission: RE | Admit: 2019-04-08 | Discharge: 2019-04-08 | Disposition: A | Discharge status: Home / Self Care | Payer: BC Managed Care – PPO | Source: Ambulatory Visit | Attending: Family Medicine | Admitting: Family Medicine

## 2019-04-08 DIAGNOSIS — Z1231 Encounter for screening mammogram for malignant neoplasm of breast: Secondary | ICD-10-CM | POA: Insufficient documentation

## 2019-04-14 ENCOUNTER — Other Ambulatory Visit: Payer: Self-pay | Admitting: *Deleted

## 2019-04-14 ENCOUNTER — Inpatient Hospital Stay
Admission: RE | Admit: 2019-04-14 | Discharge: 2019-04-14 | Disposition: A | Discharge status: Home / Self Care | Payer: Self-pay | Source: Ambulatory Visit | Attending: *Deleted | Admitting: *Deleted

## 2019-04-14 DIAGNOSIS — Z1231 Encounter for screening mammogram for malignant neoplasm of breast: Secondary | ICD-10-CM

## 2020-04-26 ENCOUNTER — Other Ambulatory Visit: Payer: Self-pay | Admitting: Family Medicine

## 2020-04-26 DIAGNOSIS — Z1231 Encounter for screening mammogram for malignant neoplasm of breast: Secondary | ICD-10-CM

## 2020-04-27 ENCOUNTER — Other Ambulatory Visit: Payer: Self-pay

## 2020-04-27 ENCOUNTER — Ambulatory Visit
Admission: RE | Admit: 2020-04-27 | Discharge: 2020-04-27 | Disposition: A | Discharge status: Home / Self Care | Payer: 59 | Source: Ambulatory Visit | Attending: Family Medicine | Admitting: Family Medicine

## 2020-04-27 DIAGNOSIS — Z1231 Encounter for screening mammogram for malignant neoplasm of breast: Secondary | ICD-10-CM | POA: Insufficient documentation

## 2020-12-21 ENCOUNTER — Ambulatory Visit (INDEPENDENT_AMBULATORY_CARE_PROVIDER_SITE_OTHER): Payer: 59 | Admitting: Podiatry

## 2020-12-21 ENCOUNTER — Encounter: Payer: Self-pay | Admitting: Podiatry

## 2020-12-21 ENCOUNTER — Other Ambulatory Visit: Payer: Self-pay

## 2020-12-21 ENCOUNTER — Other Ambulatory Visit: Payer: Self-pay | Admitting: Podiatry

## 2020-12-21 ENCOUNTER — Ambulatory Visit (INDEPENDENT_AMBULATORY_CARE_PROVIDER_SITE_OTHER): Payer: 59

## 2020-12-21 DIAGNOSIS — M722 Plantar fascial fibromatosis: Secondary | ICD-10-CM | POA: Diagnosis not present

## 2020-12-21 NOTE — Progress Notes (Signed)
Subjective:  Patient ID: Jessica Hicks, female    DOB: [birth year],  MRN: [id number]  No chief complaint on file.   50 y.o. female presents with the above complaint.  Patient presents with complaint bilateral heel pain.  Patient states that has progressed to gotten worse hurts with walking in the morning.  The right is worse than left side.  She is now starting to compensate and the outside of her foot is hurting.  She states being off of the past few months has progressed to gotten worse.  She she was told by her friend that it could be of Planter fasciitis.  She wanted to discuss treatment options for this.  She has not seen a foot and ankle specialist for this.   Review of Systems: Negative except as noted in the HPI. Denies N/V/F/Ch.  Past Medical History:  Diagnosis Date   Allergy    Anemia    WITH PREGNANCY   Asthma    WELL CONTROLLED   GERD (gastroesophageal reflux disease)    Headache    MIGRAINES    Current Outpatient Medications:    albuterol (PROVENTIL HFA;VENTOLIN HFA) 108 (90 Base) MCG/ACT inhaler, Inhale 2 puffs into the lungs every 6 (six) hours as needed for wheezing or shortness of breath. , Disp: , Rfl:    aspirin EC 325 MG tablet, Take 1 tablet (325 mg total) by mouth daily., Disp: 30 tablet, Rfl: 0   Aspirin-Salicylamide-Caffeine (BC HEADACHE POWDER PO), Take 1 Package by mouth as needed. , Disp: , Rfl:    Docusate Calcium (STOOL SOFTENER PO), Take 2 tablets by mouth at bedtime., Disp: , Rfl:    esomeprazole (NEXIUM) 20 MG capsule, Take 20 mg by mouth daily as needed (heartburn). , Disp: , Rfl:    fexofenadine-pseudoephedrine (ALLEGRA-D 24) 180-240 MG per 24 hr tablet, Take 1 tablet by mouth daily as needed (allergies). , Disp: , Rfl:    fluticasone (FLONASE) 50 MCG/ACT nasal spray, Place 1 spray into both nostrils daily as needed for allergies. , Disp: , Rfl:    HYDROcodone-acetaminophen (NORCO) 5-325 MG tablet, Take 1 tablet by mouth every 6 (six) hours as  needed for moderate pain. MAXIMUM TOTAL ACETAMINOPHEN DOSE IS 4000 MG PER DAY, Disp: 40 tablet, Rfl: 0   meloxicam (MOBIC) 15 MG tablet, Take 1 tablet (15 mg total) by mouth daily. (Patient taking differently: Take 15 mg by mouth daily as needed. ), Disp: 30 tablet, Rfl: 3   montelukast (SINGULAIR) 10 MG tablet, Take 10 mg by mouth at bedtime. , Disp: , Rfl:    ondansetron (ZOFRAN) 4 MG tablet, Take 1 tablet (4 mg total) by mouth every 8 (eight) hours as needed for nausea or vomiting., Disp: 30 tablet, Rfl: 0   PATADAY 0.2 % SOLN, Place 1 drop into both eyes daily as needed (allergies). , Disp: , Rfl:    topiramate (TOPAMAX) 100 MG tablet, Take 100 mg by mouth daily as needed (at onset of migraine). , Disp: , Rfl:   Social History   Tobacco Use  Smoking Status Never  Smokeless Tobacco Never    No Known Allergies Objective:  There were no vitals filed for this visit. There is no height or weight on file to calculate BMI. Constitutional Well developed. Well nourished.  Vascular Dorsalis pedis pulses palpable bilaterally. Posterior tibial pulses palpable bilaterally. Capillary refill normal to all digits.  No cyanosis or clubbing noted. Pedal hair growth normal.  Neurologic Normal speech. Oriented to person, place, and  time. Epicritic sensation to light touch grossly present bilaterally.  Dermatologic Nails well groomed and normal in appearance. No open wounds. No skin lesions.  Orthopedic: Normal joint ROM without pain or crepitus bilaterally. No visible deformities. Tender to palpation at the calcaneal tuber bilaterally. No pain with calcaneal squeeze bilaterally. Ankle ROM diminished range of motion bilaterally. Silfverskiold Test: positive bilaterally.   Radiographs: Taken and reviewed. No acute fractures or dislocations. No evidence of stress fracture.  Plantar heel spur present. Posterior heel spur present.   Assessment:   1. Plantar fasciitis of left foot   2. Plantar  fasciitis of right foot    Plan:  Patient was evaluated and treated and all questions answered.  Plantar Fasciitis, bilaterally - XR reviewed as above.  - Educated on icing and stretching. Instructions given.  - Injection delivered to the plantar fascia as below. - DME: Plantar Fascial Brace - Pharmacologic management: None  Procedure: Injection Tendon/Ligament Location: Bilateral plantar fascia at the glabrous junction; medial approach. Skin Prep: alcohol Injectate: 0.5 cc 0.5% marcaine plain, 0.5 cc of 1% Lidocaine, 0.5 cc kenalog 10. Disposition: Patient tolerated procedure well. Injection site dressed with a band-aid.  No follow-ups on file.

## 2021-01-18 ENCOUNTER — Ambulatory Visit: Payer: 59 | Admitting: Podiatry

## 2021-01-25 ENCOUNTER — Ambulatory Visit: Payer: 59 | Admitting: Podiatry

## 2021-01-27 ENCOUNTER — Ambulatory Visit: Payer: 59 | Admitting: Podiatry

## 2021-02-08 ENCOUNTER — Ambulatory Visit: Payer: Managed Care, Other (non HMO) | Admitting: Podiatry

## 2021-06-13 ENCOUNTER — Other Ambulatory Visit: Payer: Self-pay | Admitting: Family Medicine

## 2021-06-13 DIAGNOSIS — Z1231 Encounter for screening mammogram for malignant neoplasm of breast: Secondary | ICD-10-CM

## 2021-07-13 ENCOUNTER — Ambulatory Visit: Payer: BC Managed Care – PPO | Attending: Family Medicine

## 2021-09-23 IMAGING — MG MM DIGITAL SCREENING BILAT W/ TOMO AND CAD
8 series · 8 of 24 positions shown · non-contrast
Comparison: Previous exam(s).

CLINICAL DATA: Screening.

EXAM:
DIGITAL SCREENING BILATERAL MAMMOGRAM WITH TOMOSYNTHESIS AND CAD
TECHNIQUE: Bilateral screening digital craniocaudal and mediolateral oblique
mammograms were obtained. Bilateral screening digital breast
tomosynthesis was performed. The images were evaluated with
computer-aided detection.

[L MLO synth-2D]
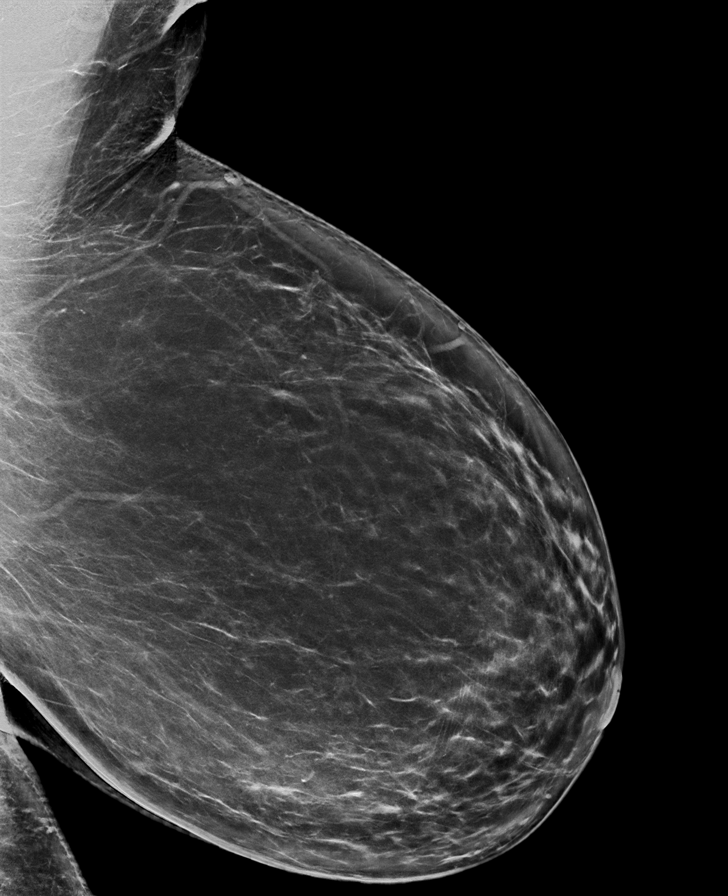

[R CC synth-2D]
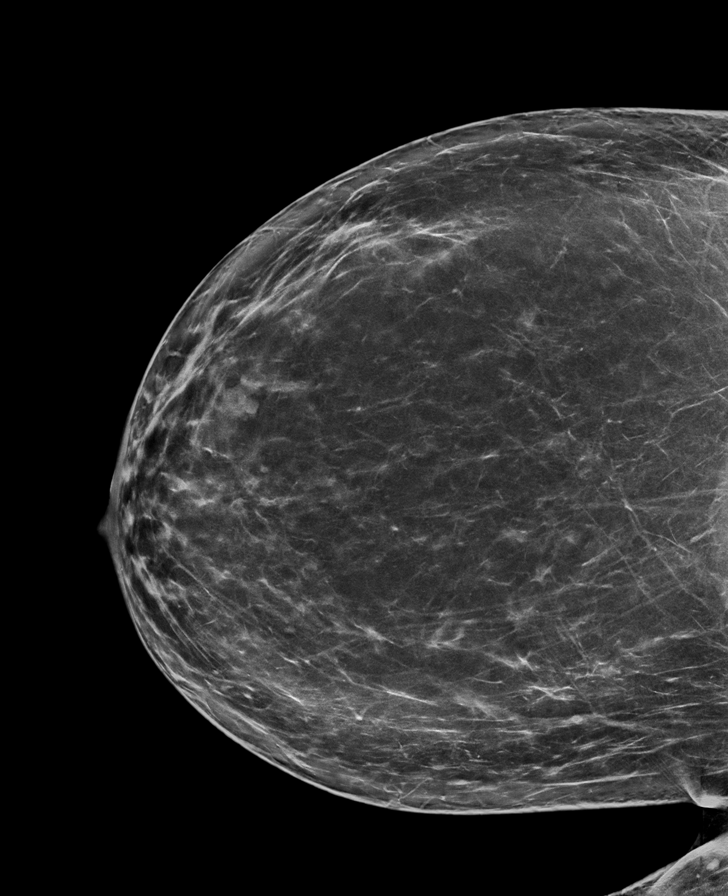

[L CC synth-2D]
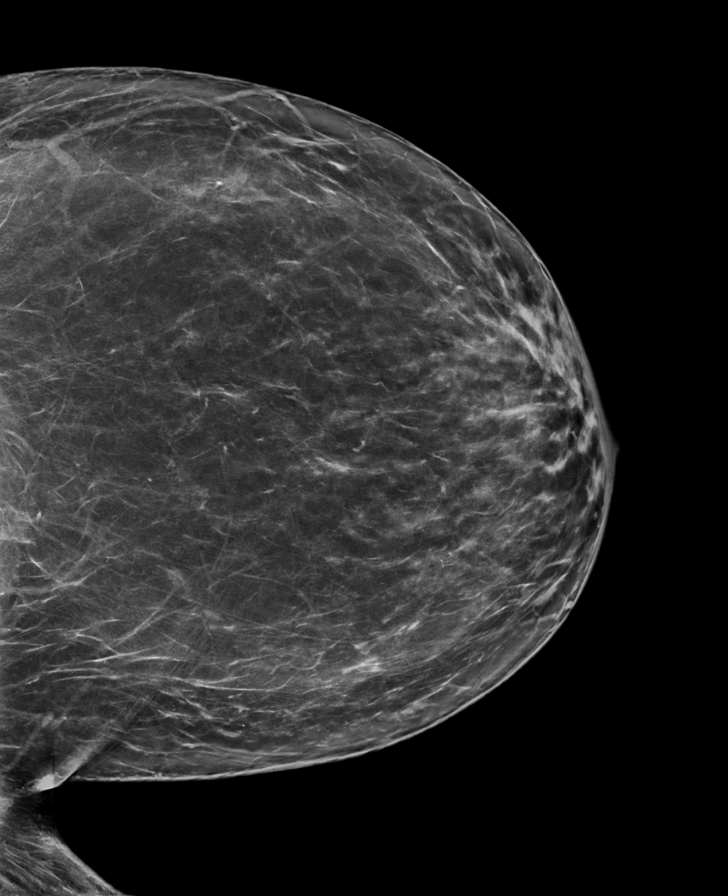

[R MLO synth-2D]
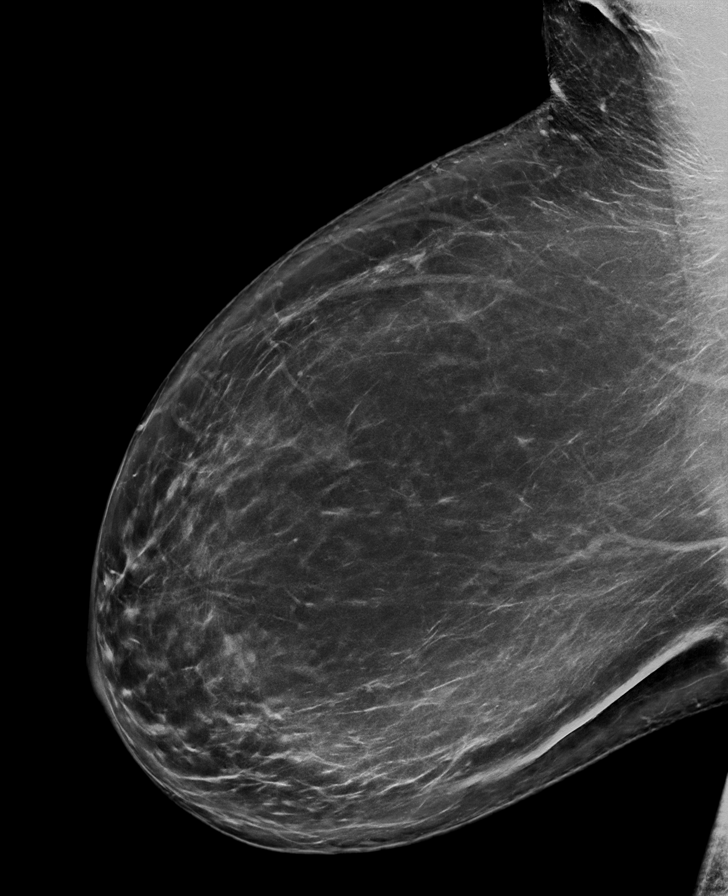

[L CC tomo · tomo slice 41/81.0]
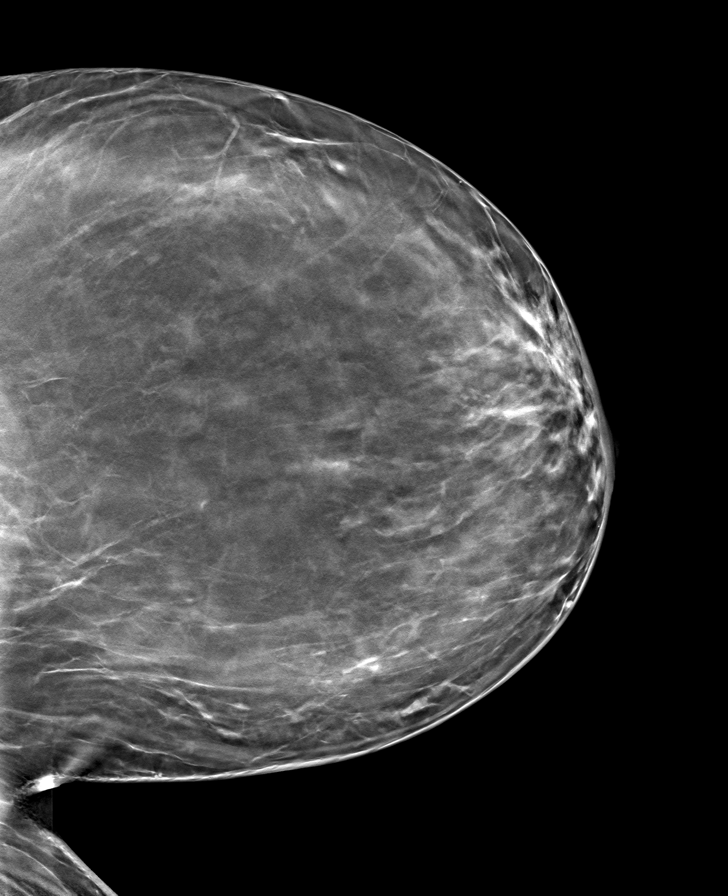

[L MLO tomo · tomo slice 45/90.0]
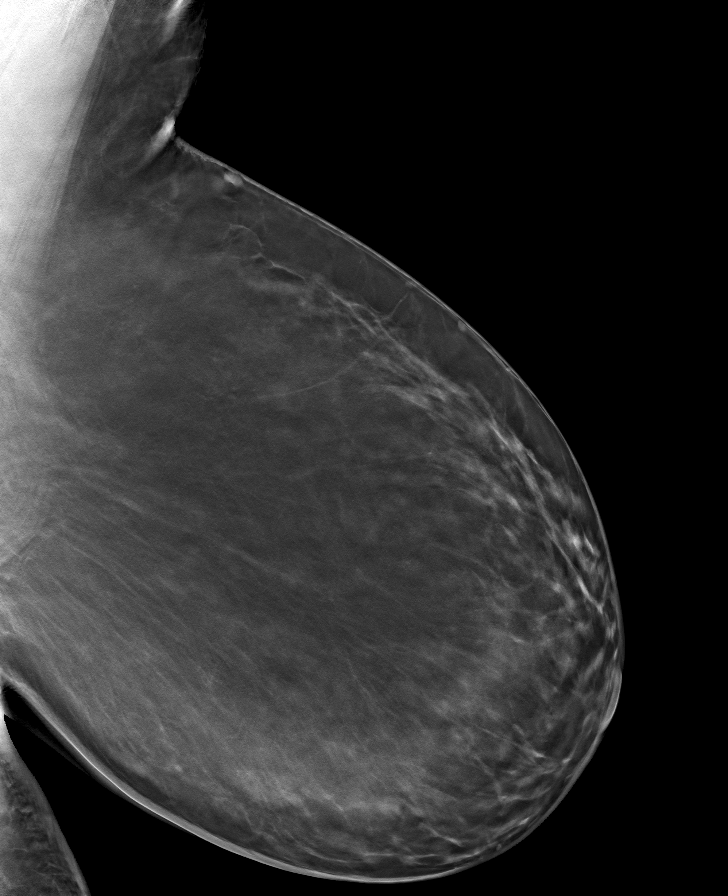

[R MLO tomo · tomo slice 47/92.0]
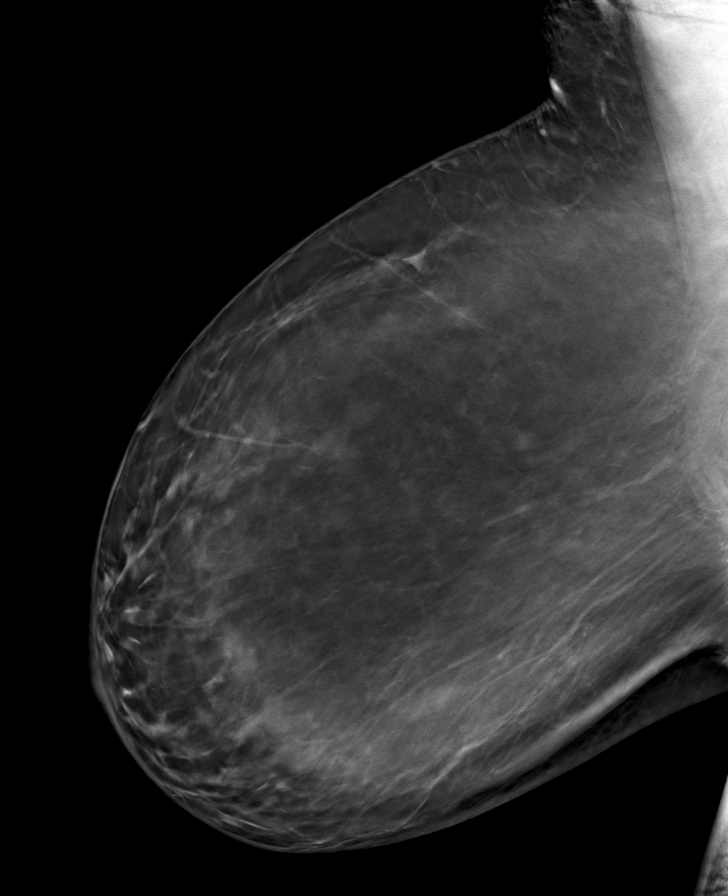

[R CC tomo · tomo slice 42/83.0]
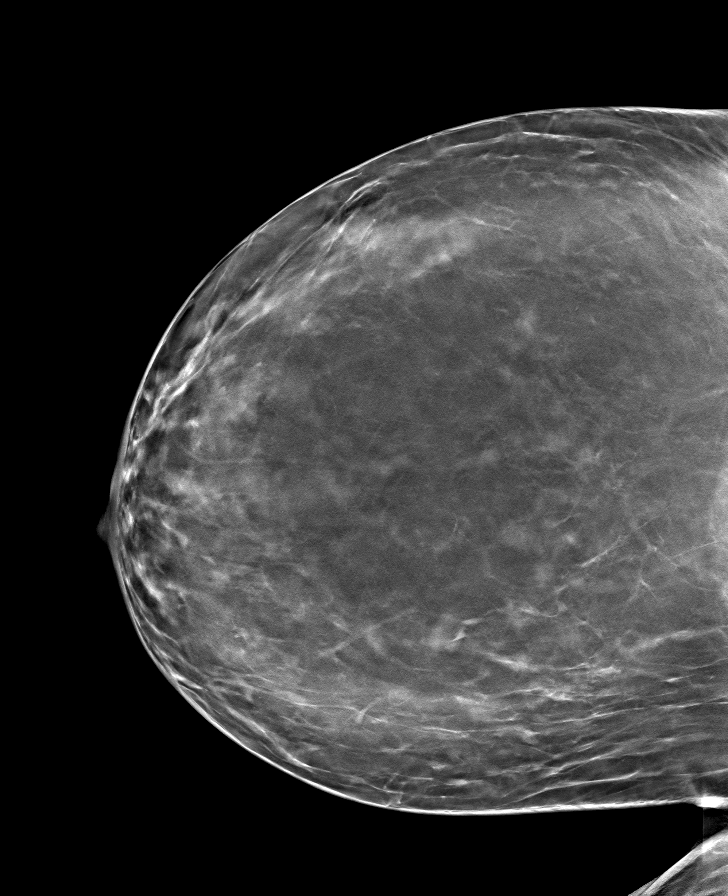

[8 of 24 positions shown; findings below may reference images not displayed]

ACR Breast Density Category b: There are scattered areas of
fibroglandular density.
FINDINGS: There are no findings suspicious for malignancy. The images were
evaluated with computer-aided detection.
IMPRESSION: No mammographic evidence of malignancy. A result letter of this
screening mammogram will be mailed directly to the patient.

RECOMMENDATION:
Screening mammogram in one year. (Code:WJ-I-BG6)

BI-RADS CATEGORY  1: Negative.

## 2021-11-07 ENCOUNTER — Other Ambulatory Visit: Payer: Self-pay | Admitting: Physician Assistant

## 2021-11-07 DIAGNOSIS — Z1231 Encounter for screening mammogram for malignant neoplasm of breast: Secondary | ICD-10-CM

## 2022-01-17 ENCOUNTER — Other Ambulatory Visit: Payer: Self-pay | Admitting: Physician Assistant

## 2022-01-17 DIAGNOSIS — R591 Generalized enlarged lymph nodes: Secondary | ICD-10-CM

## 2022-01-24 ENCOUNTER — Ambulatory Visit: Admission: RE | Admit: 2022-01-24 | Payer: Medicare Other | Source: Ambulatory Visit

## 2022-02-01 ENCOUNTER — Ambulatory Visit
Admission: RE | Admit: 2022-02-01 | Discharge: 2022-02-01 | Disposition: A | Discharge status: Home / Self Care | Payer: Medicare Other | Source: Ambulatory Visit | Attending: Physician Assistant | Admitting: Physician Assistant

## 2022-02-01 DIAGNOSIS — Z1231 Encounter for screening mammogram for malignant neoplasm of breast: Secondary | ICD-10-CM | POA: Insufficient documentation

## 2022-05-08 ENCOUNTER — Encounter: Payer: Self-pay | Admitting: Podiatry

## 2022-05-08 ENCOUNTER — Ambulatory Visit: Payer: Medicare Other | Admitting: Podiatry

## 2022-05-08 ENCOUNTER — Ambulatory Visit (INDEPENDENT_AMBULATORY_CARE_PROVIDER_SITE_OTHER): Payer: Medicare Other

## 2022-05-08 VITALS — BP 167/92 | HR 66

## 2022-05-08 DIAGNOSIS — M19071 Primary osteoarthritis, right ankle and foot: Secondary | ICD-10-CM

## 2022-05-08 DIAGNOSIS — M21619 Bunion of unspecified foot: Secondary | ICD-10-CM

## 2022-05-08 DIAGNOSIS — M67471 Ganglion, right ankle and foot: Secondary | ICD-10-CM | POA: Diagnosis not present

## 2022-05-08 DIAGNOSIS — M21611 Bunion of right foot: Secondary | ICD-10-CM

## 2022-05-08 NOTE — Progress Notes (Signed)
  Subjective:  Patient ID: Jessica Hicks, female    DOB: 1970/11/15,  MRN: [id number]  Chief Complaint  Patient presents with   Foot Pain    "This place right here just popped up." N - knot on top of foot L - dorsal 2nd met right D - end of February O - suddenly, gotten worse C - knot, swelling, tender A - certain shoes T - rubbed alcohol on it, Meloxicam,    52 y.o. female presents with the above complaint. History confirmed with patient.   Objective:  Physical Exam: warm, good capillary refill, no trophic changes or ulcerative lesions, normal DP and PT pulses, normal sensory exam, and fluctuant palpable mass over the right dorsal midfoot, minimal tenderness to palpation.   Radiographs: Multiple views x-ray of the right foot: Mild midfoot degenerative changes with dorsal spurring noted Assessment:   1. Arthritis of right midfoot   2. Ganglion cyst of right foot      Plan:  Patient was evaluated and treated and all questions answered.  We reviewed her x-rays and discussed the presence of the spurring and likely cause of the formation of the ganglion cyst.  We also discussed ganglion cyst in detail including their etiology and treatment options with injection therapy, incision and drainage and aspiration and surgical removal.  We discussed the risks and benefits of these.  Currently this is minimally tender for her and is not lifestyle limiting.  She will begin with modifications and shoe gear as well as topical Voltaren gel.  If it becomes more symptomatic she will return to see me for injection and aspiration.  Return if symptoms worsen or fail to improve.

## 2022-06-14 ENCOUNTER — Ambulatory Visit: Payer: Medicare Other | Admitting: Podiatry

## 2022-06-14 DIAGNOSIS — M67471 Ganglion, right ankle and foot: Secondary | ICD-10-CM | POA: Diagnosis not present

## 2022-06-16 ENCOUNTER — Encounter: Payer: Self-pay | Admitting: Podiatry

## 2022-06-16 NOTE — Progress Notes (Signed)
  Subjective:  Patient ID: Jessica Hicks, female    DOB: 1970/10/24,  MRN: [id number]  Chief Complaint  Patient presents with   Cyst     Cyst on R foot follow up - she tried the voltaren gel but it did nothing. It's very painful and maybe a little bigger    52 y.o. female presents with the above complaint. History confirmed with patient.  She is interested in draining the cyst today  Objective:  Physical Exam: warm, good capillary refill, no trophic changes or ulcerative lesions, normal DP and PT pulses, normal sensory exam, and fluctuant palpable mass over the right dorsal midfoot, she has quite bit of tenderness to palpation.   Radiographs: Multiple views x-ray of the right foot: Mild midfoot degenerative changes with dorsal spurring noted Assessment:   1. Ganglion cyst of right foot      Plan:  Patient was evaluated and treated and all questions answered.  We reviewed her progress.  Has not responded to NSAID topical treatment.  We discussed drainage and injection.  We discussed the risk benefits and potential complications.  All questions addressed prior to procedure.  Following consent a local field block was made with 1.5 cc of 0.5% Marcaine plain with 2% lidocaine with epinephrine.  The site was then prepped with Betadine.  The area over the cyst was then incised with a #11 blade.  Manual pressure was used to extrude the contents of the cyst which was consistent with ganglion cyst material.  A Steri-Strip was applied.  4 mg of dexamethasone was injected into the area.  Post op dressing of Telfa gauze and Coban was applied.  Post care instructions given.  We discussed the risk of recurrence even with drainage and injection.  Long-term discussed surgical excision of this if it continues to be a problem.  No follow-ups on file.

## 2022-12-06 ENCOUNTER — Ambulatory Visit (INDEPENDENT_AMBULATORY_CARE_PROVIDER_SITE_OTHER): Payer: Medicare Other | Admitting: Podiatry

## 2022-12-06 ENCOUNTER — Encounter: Payer: Self-pay | Admitting: Podiatry

## 2022-12-06 VITALS — Ht 62.5 in | Wt 170.0 lb

## 2022-12-06 DIAGNOSIS — M67471 Ganglion, right ankle and foot: Secondary | ICD-10-CM | POA: Diagnosis not present

## 2022-12-06 DIAGNOSIS — M898X7 Other specified disorders of bone, ankle and foot: Secondary | ICD-10-CM | POA: Diagnosis not present

## 2022-12-06 NOTE — Progress Notes (Signed)
  Subjective:  Patient ID: Jessica Hicks, female    DOB: October 24, 1970,  MRN: [id number]  Chief Complaint  Patient presents with   Foot Pain    Pt is here due to blister pocket on right foot was seen here for the same, pt states it painful to touch.    52 y.o. female presents with the above complaint. History confirmed with patient.  Cyst has returned and is painful again Objective:  Physical Exam: warm, good capillary refill, no trophic changes or ulcerative lesions, normal DP and PT pulses, normal sensory exam, and fluctuant palpable mass over the right dorsal midfoot, she has quite bit of tenderness to palpation.   Radiographs: Multiple views x-ray of the right foot: Mild midfoot degenerative changes with dorsal spurring noted Assessment:   1. Ganglion cyst of right foot      Plan:  Patient was evaluated and treated and all questions answered.  Ganglion on the dorsal right foot has returned likely due to the underlying spurring.  Do not think that further drainage at this point would be beneficial and we discussed surgical excision.  She is interested in pursuing this.  We discussed the risk benefits and potential complications including the risk of infection nerve pain or nerve damage scarring and skin healing issues.  We discussed the postoperative course including restricted weightbearing in a surgical shoe for approximately 2 to 3 weeks while her sutures heal.  She understands wishes to proceed.  Informed consent signed and reviewed.   Surgical plan:  Procedure: -Right foot ganglion cyst excision and exostectomy  Location: -[facility]  Anesthesia plan: -Sedation with local  Postoperative pain plan: - Tylenol 1000 mg every 6 hours, ibuprofen 600 mg every 6 hours, gabapentin 300 mg every 8 hours x5 days, oxycodone 5 mg 1-2 tabs every 6 hours only as needed  DVT prophylaxis: -None required  WB Restrictions / DME needs: -WBAT in surgical shoe   No follow-ups on file.

## 2023-01-17 ENCOUNTER — Telehealth: Payer: Self-pay | Admitting: Podiatry

## 2023-01-17 NOTE — Telephone Encounter (Signed)
DOS-02/23/23  OSTECT. COMP. MET. HEAD RT-28112 EXC. GANGLION TUMOR RT-28090  [employer] EFFECTIVE DATE- 01/30/22  DEDUCTIBLE- $7500.00 WITH REMAINING $7500.00 OOP-$9400.00 WITH REMAINING $9,225.28  COINSURANCE- 50%  SPOKE WITH ELLE I. FROM [employer] AND SHE STATED THAT PRIOR AUTH IS NOT REQUIRED FOR CPT CODES 53664 AND 28090.  CALL REFERENCE #: [id number]  [facility] EFFECTIVE DATE- 01/30/22  DEDUCTIBLE- $0.00 WITH REMAINING $0.00 OOP-$3600.00 WITH REMAINING $3282.71 COINSURANCE- 0%  PER THE [employer] WEBSITE PORTAL, PRIOR AUTH IS NOT REQUIRED FOR CPT CODES 56387 AND 28090.   AUTH Decision ID #: [id number]

## 2023-02-22 ENCOUNTER — Other Ambulatory Visit: Payer: Self-pay | Admitting: Podiatry

## 2023-02-22 MED ORDER — ACETAMINOPHEN 500 MG PO TABS: 1000.0000 mg | ORAL_TABLET | Freq: Four times a day (QID) | ORAL | 0 refills | Status: AC | PRN

## 2023-02-22 MED ORDER — GABAPENTIN 300 MG PO CAPS: 300.0000 mg | ORAL_CAPSULE | Freq: Three times a day (TID) | ORAL | 0 refills | Status: AC

## 2023-02-22 MED ORDER — IBUPROFEN 600 MG PO TABS: 600.0000 mg | ORAL_TABLET | Freq: Four times a day (QID) | ORAL | 0 refills | Status: AC | PRN

## 2023-02-22 MED ORDER — TRAMADOL HCL 50 MG PO TABS: 50.0000 mg | ORAL_TABLET | Freq: Four times a day (QID) | ORAL | 0 refills | Status: AC | PRN

## 2023-02-22 NOTE — Progress Notes (Signed)
02/23/2023 ganglion cyst excision

## 2023-02-23 DIAGNOSIS — M67471 Ganglion, right ankle and foot: Secondary | ICD-10-CM | POA: Diagnosis not present

## 2023-02-23 DIAGNOSIS — M898X7 Other specified disorders of bone, ankle and foot: Secondary | ICD-10-CM | POA: Diagnosis not present

## 2023-02-23 DIAGNOSIS — M25774 Osteophyte, right foot: Secondary | ICD-10-CM | POA: Diagnosis not present

## 2023-02-28 ENCOUNTER — Ambulatory Visit (INDEPENDENT_AMBULATORY_CARE_PROVIDER_SITE_OTHER): Payer: Medicare Other

## 2023-02-28 ENCOUNTER — Ambulatory Visit (INDEPENDENT_AMBULATORY_CARE_PROVIDER_SITE_OTHER): Payer: Self-pay | Admitting: Podiatry

## 2023-02-28 ENCOUNTER — Encounter: Payer: Self-pay | Admitting: Podiatry

## 2023-02-28 VITALS — Ht 62.5 in | Wt 170.0 lb

## 2023-02-28 DIAGNOSIS — M67471 Ganglion, right ankle and foot: Secondary | ICD-10-CM

## 2023-02-28 DIAGNOSIS — M898X7 Other specified disorders of bone, ankle and foot: Secondary | ICD-10-CM

## 2023-02-28 NOTE — Progress Notes (Signed)
  Subjective:  Patient ID: Jessica Hicks, female    DOB: [birth year],  MRN: [id number]  Chief Complaint  Patient presents with   Routine Post Op    Pt is here for post op visit after surgery, pt states her foot is ok, she continues to ice and stay off as much as she can, new XRAYS done, no other complaints.    DOS: 02/23/2023 Procedure: Ganglion cyst excision, exostectomy  53 y.o. female returns for post-op check.  Doing well pain is well-controlled  Review of Systems: Negative except as noted in the HPI. Denies N/V/F/Ch.   Objective:  There were no vitals filed for this visit. Body mass index is 30.6 kg/m. Constitutional Well developed. Well nourished.  Vascular Foot warm and well perfused. Capillary refill normal to all digits.  Calf is soft and supple, no posterior calf or knee pain, negative Homans' sign  Neurologic Normal speech. Oriented to person, place, and time. Epicritic sensation to light touch grossly present bilaterally.  Dermatologic Skin healing well without signs of infection. Skin edges well coapted without signs of infection.  Orthopedic: Tenderness to palpation noted about the surgical site.   Multiple view plain film radiographs: Interval removal of dorsal spurring of the cuneiform   Final path report is pending but we discussed that nothing abnormal was clinically observed Assessment:   1. Ganglion cyst of right foot   2. Exostosis of right foot    Plan:  Patient was evaluated and treated and all questions answered.  S/p foot surgery right -Progressing as expected post-operatively. -XR: Noted above good reduction of spurring -WB Status: Weightbearing as tolerated in CAM boot -Sutures: Return in 2 weeks to remove. -Medications: No refills required -Foot redressed.  May remove and shower on Monday -We discussed the cyst was closely intimately associated with the deep peroneal nerve and she may have some residual numbness or tingling or neuritic  symptoms hopefully temporarily but could be long-term.  No follow-ups on file.

## 2023-03-14 ENCOUNTER — Encounter: Payer: Self-pay | Admitting: Podiatry

## 2023-03-14 ENCOUNTER — Ambulatory Visit (INDEPENDENT_AMBULATORY_CARE_PROVIDER_SITE_OTHER): Payer: 59 | Admitting: Podiatry

## 2023-03-14 DIAGNOSIS — M898X7 Other specified disorders of bone, ankle and foot: Secondary | ICD-10-CM

## 2023-03-14 DIAGNOSIS — M67471 Ganglion, right ankle and foot: Secondary | ICD-10-CM

## 2023-03-14 NOTE — Progress Notes (Signed)
  Subjective:  Patient ID: Jessica Hicks, female    DOB: 05-Apr-1970,  MRN: [id number]  Chief Complaint  Patient presents with   Routine Post Op    POV #2, DOS 02/23/23, REMOVAL OF GANGLION CYST RIGHT FOOT AND BONE SPUR REMOVAL "It's still sore."    DOS: 02/23/2023 Procedure: Ganglion cyst excision, exostectomy  53 y.o. female returns for post-op check.  Doing well pain is well-controlled  Review of Systems: Negative except as noted in the HPI. Denies N/V/F/Ch.   Objective:  There were no vitals filed for this visit. There is no height or weight on file to calculate BMI. Constitutional Well developed. Well nourished.  Vascular Foot warm and well perfused. Capillary refill normal to all digits.  Calf is soft and supple, no posterior calf or knee pain, negative Homans' sign  Neurologic Normal speech. Oriented to person, place, and time. Epicritic sensation to light touch grossly present bilaterally.  Dermatologic Incision well-healed on hypertrophic  Orthopedic: Minimal pain to palpation noted about the surgical site.   Multiple view plain film radiographs: Interval removal of dorsal spurring of the cuneiform   Final path report consist with a ganglion cyst Assessment:   1. Ganglion cyst of right foot   2. Exostosis of right foot     Plan:  Patient was evaluated and treated and all questions answered.  S/p foot surgery right -Sutures removed uneventfully.  Gradually return to shoe gear and activity.  Range of motion of the toes encouraged.  We also discussed that the cyst was intimately associated with the nerve and if she does have any persistent numbness tingling burning or shooting pains that persist more than a couple months after surgery that she should let me know for cortisone injection.  Follow-up with me as needed.  Return if symptoms worsen or fail to improve.

## 2023-03-26 ENCOUNTER — Encounter: Payer: Self-pay | Admitting: Podiatry

## 2023-04-04 ENCOUNTER — Encounter: Payer: 59 | Admitting: Podiatry

## 2023-10-11 ENCOUNTER — Other Ambulatory Visit: Payer: Self-pay | Admitting: Physician Assistant

## 2023-10-11 DIAGNOSIS — Z1231 Encounter for screening mammogram for malignant neoplasm of breast: Secondary | ICD-10-CM

## 2023-11-02 ENCOUNTER — Encounter

## 2023-11-20 ENCOUNTER — Ambulatory Visit
Admission: RE | Admit: 2023-11-20 | Discharge: 2023-11-20 | Disposition: A | Discharge status: Home / Self Care | Payer: Self-pay | Source: Ambulatory Visit | Attending: Physician Assistant | Admitting: Physician Assistant

## 2023-11-20 DIAGNOSIS — Z1231 Encounter for screening mammogram for malignant neoplasm of breast: Secondary | ICD-10-CM | POA: Diagnosis present
